# Patient Record
Sex: Female | Born: 1975 | Race: White | Hispanic: No | Marital: Single | Smoking: Current some day smoker
Health system: Southern US, Community
[De-identification: ages and names within clinical notes are randomized; demographics above are authoritative.]

## PROBLEM LIST (undated history)

## (undated) DIAGNOSIS — E079 Disorder of thyroid, unspecified: Secondary | ICD-10-CM

## (undated) HISTORY — PX: CHOLECYSTECTOMY: SHX55

## (undated) HISTORY — PX: TUBAL LIGATION: SHX77

---

## 2009-05-31 ENCOUNTER — Ambulatory Visit: Payer: Self-pay | Admitting: Family Medicine

## 2010-01-04 ENCOUNTER — Ambulatory Visit: Payer: Self-pay | Admitting: Internal Medicine

## 2017-05-29 ENCOUNTER — Emergency Department: Payer: No Typology Code available for payment source

## 2017-05-29 ENCOUNTER — Encounter: Payer: Self-pay | Admitting: Emergency Medicine

## 2017-05-29 ENCOUNTER — Emergency Department
Admission: EM | Admit: 2017-05-29 | Discharge: 2017-05-29 | Disposition: A | Discharge status: Home / Self Care | Payer: No Typology Code available for payment source | Attending: Emergency Medicine | Admitting: Emergency Medicine

## 2017-05-29 ENCOUNTER — Other Ambulatory Visit: Payer: Self-pay

## 2017-05-29 DIAGNOSIS — M7918 Myalgia, other site: Secondary | ICD-10-CM | POA: Diagnosis not present

## 2017-05-29 DIAGNOSIS — F17210 Nicotine dependence, cigarettes, uncomplicated: Secondary | ICD-10-CM | POA: Diagnosis not present

## 2017-05-29 DIAGNOSIS — S199XXA Unspecified injury of neck, initial encounter: Secondary | ICD-10-CM | POA: Diagnosis present

## 2017-05-29 DIAGNOSIS — S20212A Contusion of left front wall of thorax, initial encounter: Secondary | ICD-10-CM | POA: Insufficient documentation

## 2017-05-29 DIAGNOSIS — S161XXA Strain of muscle, fascia and tendon at neck level, initial encounter: Secondary | ICD-10-CM | POA: Diagnosis not present

## 2017-05-29 DIAGNOSIS — S50812A Abrasion of left forearm, initial encounter: Secondary | ICD-10-CM | POA: Diagnosis not present

## 2017-05-29 DIAGNOSIS — S298XXA Other specified injuries of thorax, initial encounter: Secondary | ICD-10-CM

## 2017-05-29 DIAGNOSIS — Y999 Unspecified external cause status: Secondary | ICD-10-CM | POA: Insufficient documentation

## 2017-05-29 DIAGNOSIS — S0990XA Unspecified injury of head, initial encounter: Secondary | ICD-10-CM | POA: Diagnosis not present

## 2017-05-29 DIAGNOSIS — Y9389 Activity, other specified: Secondary | ICD-10-CM | POA: Diagnosis not present

## 2017-05-29 DIAGNOSIS — Y92410 Unspecified street and highway as the place of occurrence of the external cause: Secondary | ICD-10-CM | POA: Insufficient documentation

## 2017-05-29 HISTORY — DX: Disorder of thyroid, unspecified: E07.9

## 2017-05-29 MED ORDER — NAPROXEN 500 MG PO TABS: 500.0000 mg | ORAL_TABLET | Freq: Two times a day (BID) | ORAL | 0 refills | Status: AC

## 2017-05-29 MED ORDER — NAPROXEN 500 MG PO TABS
500.0000 mg | ORAL_TABLET | Freq: Once | ORAL | Status: AC
  Administered 2017-05-29: 500 mg via ORAL
  Filled 2017-05-29: qty 1

## 2017-05-29 MED ORDER — DIAZEPAM 2 MG PO TABS: 2.0000 mg | ORAL_TABLET | Freq: Three times a day (TID) | ORAL | 0 refills | Status: AC | PRN

## 2017-05-29 MED ORDER — ACETAMINOPHEN 325 MG PO TABS
650.0000 mg | ORAL_TABLET | Freq: Once | ORAL | Status: AC
  Administered 2017-05-29: 650 mg via ORAL
  Filled 2017-05-29: qty 2

## 2017-05-29 MED ORDER — DIAZEPAM 2 MG PO TABS
2.0000 mg | ORAL_TABLET | Freq: Once | ORAL | Status: AC
  Administered 2017-05-29: 2 mg via ORAL
  Filled 2017-05-29: qty 1
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
## 2017-05-29 NOTE — ED Notes (Signed)
Pt has mild swelling, no obvious deformity to left forearm, chest rise even and unlabored, A&Ox4, speaking in complete and coherent sentences, PERRLA, able to follow commands.

## 2017-05-29 NOTE — Discharge Instructions (Signed)
Follow up with your primary care provider if not feeling better in a week. Return to the ER for symptoms that change or worsen if unable to schedule an appointment.
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
## 2022-07-14 ENCOUNTER — Encounter: Payer: Self-pay | Admitting: *Deleted

## 2022-07-14 ENCOUNTER — Ambulatory Visit
Admission: EM | Admit: 2022-07-14 | Discharge: 2022-07-14 | Disposition: A | Discharge status: Home / Self Care | Payer: 59 | Attending: Physician Assistant | Admitting: Physician Assistant

## 2022-07-14 DIAGNOSIS — N3000 Acute cystitis without hematuria: Secondary | ICD-10-CM | POA: Diagnosis not present

## 2022-07-14 DIAGNOSIS — B9689 Other specified bacterial agents as the cause of diseases classified elsewhere: Secondary | ICD-10-CM

## 2022-07-14 DIAGNOSIS — N760 Acute vaginitis: Secondary | ICD-10-CM | POA: Diagnosis present

## 2022-07-14 DIAGNOSIS — R350 Frequency of micturition: Secondary | ICD-10-CM | POA: Diagnosis not present

## 2022-07-14 LAB — WET PREP, GENITAL
Sperm: NONE SEEN
Trich, Wet Prep: NONE SEEN
WBC, Wet Prep HPF POC: >10 — AB (ref <10)
Yeast Wet Prep HPF POC: NONE SEEN

## 2022-07-14 LAB — URINALYSIS, W/ REFLEX TO CULTURE (INFECTION SUSPECTED)
Bilirubin Urine: NEGATIVE
Glucose, UA: NEGATIVE mg/dL
Ketones, ur: NEGATIVE mg/dL
Nitrite: NEGATIVE
Protein, ur: NEGATIVE mg/dL
Specific Gravity, Urine: 1.01 (ref 1.005–1.030)
pH: 6 (ref 5.0–8.0)

## 2022-07-14 LAB — PREGNANCY, URINE: Preg Test, Ur: NEGATIVE

## 2022-07-14 MED ORDER — METRONIDAZOLE 500 MG PO TABS: 500.0000 mg | ORAL_TABLET | Freq: Two times a day (BID) | ORAL | 0 refills | Status: AC

## 2022-07-14 MED ORDER — SULFAMETHOXAZOLE-TRIMETHOPRIM 800-160 MG PO TABS: 1.0000 | ORAL_TABLET | Freq: Two times a day (BID) | ORAL | 0 refills | Status: AC

## 2022-07-14 NOTE — ED Triage Notes (Signed)
Pt states she has had vaginal bleeding once on Saturday after sexual intercourse. The bleeding stopped but now her cycle is started yesterday. She called PCP and has a ultrasound scheduled. She also has had urine frequency since X 2 days.

## 2022-07-14 NOTE — Discharge Instructions (Addendum)
The most common types of vaginal infections are yeast infections and bacterial vaginosis. Neither of which are really considered to be sexually transmitted. Often a pH swab or wet prep is performed and if abnormal may reveal either type of infection. Begin metronidazole if prescribed for possible BV infection. If there is concern for yeast infection, fluconazole is often prescribed . Take this as directed. You may also apply topical miconazole (can be purchased OTC) externally for relief of itching. Increase rest and fluid intake. If labs sent out, we will call within 2-5 days with results and amend treatment if necessary. Always try to use pH balanced washes/wipes, urinate after intercourse, stay hydrated, and take probiotics if you are prone to vaginal infections. Return or see PCP or gynecologist for new/worsening infections.    UTI: Based on either symptoms or urinalysis, you may have a urinary tract infection. We will send the urine for culture and call with results in a few days. Begin antibiotics at this time. Your symptoms should be much improved over the next 2-3 days. Increase rest and fluid intake. If for some reason symptoms are worsening or not improving after a couple of days or the urine culture determines the antibiotics you are taking will not treat the infection, the antibiotics may be changed. Return or go to ER for fever, back pain, worsening urinary pain, discharge, increased blood in urine. May take Tylenol or Motrin OTC for pain relief or consider AZO if no contraindications  

## 2022-07-14 NOTE — ED Provider Notes (Signed)
[facility]    CSN: [id number] Arrival date & time: 07/14/22  1849      History   Chief Complaint Chief Complaint  Patient presents with   Urinary Frequency   Vaginal Bleeding    HPI Bethany Dunn is a 47 y.o. female presenting for concerns about potential abnormal vaginal bleeding.  Patient reports having vaginal bleeding (a pool of blood) when having an orgasm on Saturday and Sunday.  Says she was not having penetration at the time of the vaginal bleeding.  Reports one of the time she was receiving oral sex.  Reports starting menstrual period today.   Reports urinary frequency and urgency but no pain.   Denies odor, discharge, abdominal/pelvic pain.   Reports new sexual partner in the past 6 months. No recent STI testing.  Contacted PCP today and they ordered a pelvic ultrasound for her to have done in the next few days.  She says she has to call and schedule it.  HPI  Past Medical History:  Diagnosis Date   Thyroid disease     There are no problems to display for this patient.   Past Surgical History:  Procedure Laterality Date   CHOLECYSTECTOMY     TUBAL LIGATION      OB History   No obstetric history on file.      Home Medications    Prior to Admission medications   Medication Sig Start Date End Date Taking? Authorizing Provider  levothyroxine (SYNTHROID) 50 MCG tablet Take 1 tablet by mouth daily. 11/17/18  Yes [provider]  metroNIDAZOLE (FLAGYL) 500 MG tablet Take 1 tablet (500 mg total) by mouth 2 (two) times daily for 7 days. 07/14/22 07/21/22 Yes Eusebio Friendly B, PA-C  sulfamethoxazole-trimethoprim (BACTRIM DS) 800-160 MG tablet Take 1 tablet by mouth 2 (two) times daily for 5 days. 07/14/22 07/19/22 Yes Eusebio Friendly B, PA-C  diazepam (VALIUM) 2 MG tablet Take 1 tablet (2 mg total) by mouth every 8 (eight) hours as needed. 05/29/17   Triplett, Cari B, FNP  naproxen (NAPROSYN) 500 MG tablet Take 1 tablet (500 mg total) by mouth 2  (two) times daily with a meal. 05/29/17   Triplett, Kasandra Knudsen, FNP    Family History History reviewed. No pertinent family history.  Social History Social History   Tobacco Use   Smoking status: Some Days    Types: Cigarettes   Smokeless tobacco: Never  Vaping Use   Vaping Use: Never used  Substance Use Topics   Alcohol use: Never   Drug use: Never     Allergies   Patient has no active allergies.   Review of Systems Review of Systems  Constitutional:  Negative for chills, fatigue and fever.  Gastrointestinal:  Negative for abdominal pain, diarrhea, nausea and vomiting.  Genitourinary:  Positive for frequency and vaginal bleeding. Negative for decreased urine volume, dysuria, flank pain, hematuria, pelvic pain, urgency, vaginal discharge and vaginal pain.  Musculoskeletal:  Negative for back pain.  Skin:  Negative for rash.     Physical Exam Triage Vital Signs ED Triage Vitals  Enc Vitals Group     BP 07/14/22 1918 (!) 153/108     Pulse Rate 07/14/22 1918 (!) 103     Resp 07/14/22 1918 18     Temp 07/14/22 1918 98.1 F (36.7 C)     Temp Source 07/14/22 1918 Oral     SpO2 07/14/22 1918 100 %     Weight --  Height --      Head Circumference --      Peak Flow --      Pain Score 07/14/22 1916 0     Pain Loc --      Pain Edu? --      Excl. in GC? --    No data found.  Updated Vital Signs BP (!) 153/108 (BP Location: Left Arm)   Pulse (!) 103   Temp 98.1 F (36.7 C) (Oral)   Resp 18   LMP 07/14/2022 (Exact Date)   SpO2 100%      Physical Exam Vitals and nursing note reviewed.  Constitutional:      General: She is not in acute distress.    Appearance: Normal appearance. She is not ill-appearing or toxic-appearing.  HENT:     Head: Normocephalic and atraumatic.  Eyes:     General: No scleral icterus.       Right eye: No discharge.        Left eye: No discharge.     Conjunctiva/sclera: Conjunctivae normal.  Cardiovascular:     Rate and Rhythm:  Normal rate and regular rhythm.     Heart sounds: Normal heart sounds.  Pulmonary:     Effort: Pulmonary effort is normal. No respiratory distress.     Breath sounds: Normal breath sounds.  Abdominal:     Palpations: Abdomen is soft.     Tenderness: There is no abdominal tenderness. There is no right CVA tenderness or left CVA tenderness.  Musculoskeletal:     Cervical back: Neck supple.  Skin:    General: Skin is dry.  Neurological:     General: No focal deficit present.     Mental Status: She is alert. Mental status is at baseline.     Motor: No weakness.     Gait: Gait normal.  Psychiatric:        Mood and Affect: Mood normal.        Behavior: Behavior normal.        Thought Content: Thought content normal.      UC Treatments / Results  Labs (all labs ordered are listed, but only abnormal results are displayed)   EKG   Radiology No results found.  Procedures Procedures (including critical care time)  Medications Ordered in UC Medications - No data to display  Initial Impression / Assessment and Plan / UC Course  I have reviewed the triage vital signs and the nursing notes.  Pertinent labs & imaging results that were available during my care of the patient were reviewed by me and considered in my medical decision making (see chart for details).   47 year old female presents for concerns about a couple episodes of vaginal bleeding when having orgasms over the weekend.  Started menstrual period today.  Also reports urinary frequency and urgency but no painful urination.  New sexual partner in the past 6 months.  No significant concern for STIs but open to testing.  PCP ordered a pelvic ultrasound for her to have done.  Last Pap smear was performed in 2022 and she says it was normal.  Urinalysis today shows hazy urine with small hemoglobin, trace leukocytes and many bacteria.  Will send urine for culture. Urine pregnancy negative.  Wet prep and cytology swab  obtained for GC/chlamydia and trichomonas testing.  Wet prep shows clue cells.  Will treat for BV with metronidazole.  Patient has numerous episodes of frequent urination at the urgent care.  Will treat for urinary  tract infection based on symptoms and urinalysis.  Sent Bactrim DS x 5 days.  Will amend treatment based on culture if needed.  Advised patient to follow-up with PCP for the pelvic ultrasound especially if she continues to have bleeding episodes after sex.   Final Clinical Impressions(s) / UC Diagnoses   Final diagnoses:  Bacterial vaginosis  Urinary frequency  Acute cystitis without hematuria     Discharge Instructions      The most common types of vaginal infections are yeast infections and bacterial vaginosis. Neither of which are really considered to be sexually transmitted. Often a pH swab or wet prep is performed and if abnormal may reveal either type of infection. Begin metronidazole if prescribed for possible BV infection. If there is concern for yeast infection, fluconazole is often prescribed . Take this as directed. You may also apply topical miconazole (can be purchased OTC) externally for relief of itching. Increase rest and fluid intake. If labs sent out, we will call within 2-5 days with results and amend treatment if necessary. Always try to use pH balanced washes/wipes, urinate after intercourse, stay hydrated, and take probiotics if you are prone to vaginal infections. Return or see PCP or gynecologist for new/worsening infections.    UTI: Based on either symptoms or urinalysis, you may have a urinary tract infection. We will send the urine for culture and call with results in a few days. Begin antibiotics at this time. Your symptoms should be much improved over the next 2-3 days. Increase rest and fluid intake. If for some reason symptoms are worsening or not improving after a couple of days or the urine culture determines the antibiotics you are taking will not  treat the infection, the antibiotics may be changed. Return or go to ER for fever, back pain, worsening urinary pain, discharge, increased blood in urine. May take Tylenol or Motrin OTC for pain relief or consider AZO if no contraindications      ED Prescriptions     Medication Sig Dispense Auth. Provider   metroNIDAZOLE (FLAGYL) 500 MG tablet Take 1 tablet (500 mg total) by mouth 2 (two) times daily for 7 days. 14 tablet Eusebio Friendly B, PA-C   sulfamethoxazole-trimethoprim (BACTRIM DS) 800-160 MG tablet Take 1 tablet by mouth 2 (two) times daily for 5 days. 10 tablet Gareth Morgan      PDMP not reviewed this encounter.   Eusebio Friendly B, PA-C 07/17/22 [phone]

## 2022-07-16 LAB — URINE CULTURE

## 2022-07-16 LAB — CYTOLOGY, (ORAL, ANAL, URETHRAL) ANCILLARY ONLY
Chlamydia: NEGATIVE
Comment: NEGATIVE
Comment: NEGATIVE
Comment: NORMAL
Neisseria Gonorrhea: NEGATIVE
Trichomonas: NEGATIVE

## 2022-07-17 LAB — URINE CULTURE: Culture: >=100000 — AB
